# Patient Record
Sex: Female | Born: 1995 | Hispanic: Yes
Health system: Midwestern US, Community
[De-identification: ages and names within clinical notes are randomized; demographics above are authoritative.]

---

## 2016-02-29 ENCOUNTER — Encounter: Payer: Self-pay | Admitting: Gynecology

## 2016-02-29 ENCOUNTER — Emergency Department
Admission: EM | Admit: 2016-02-29 | Discharge: 2016-02-29 | Disposition: A | Discharge status: Home / Self Care | Payer: BLUE CROSS/BLUE SHIELD | Attending: Emergency Medicine | Admitting: Emergency Medicine

## 2016-02-29 ENCOUNTER — Ambulatory Visit (INDEPENDENT_AMBULATORY_CARE_PROVIDER_SITE_OTHER)
Admission: EM | Admit: 2016-02-29 | Discharge: 2016-02-29 | Disposition: A | Discharge status: Home / Self Care | Payer: BLUE CROSS/BLUE SHIELD | Source: Home / Self Care | Attending: Family Medicine | Admitting: Family Medicine

## 2016-02-29 DIAGNOSIS — R51 Headache: Secondary | ICD-10-CM | POA: Diagnosis present

## 2016-02-29 DIAGNOSIS — F329 Major depressive disorder, single episode, unspecified: Secondary | ICD-10-CM | POA: Insufficient documentation

## 2016-02-29 DIAGNOSIS — N390 Urinary tract infection, site not specified: Secondary | ICD-10-CM

## 2016-02-29 DIAGNOSIS — T50901A Poisoning by unspecified drugs, medicaments and biological substances, accidental (unintentional), initial encounter: Secondary | ICD-10-CM | POA: Insufficient documentation

## 2016-02-29 DIAGNOSIS — R112 Nausea with vomiting, unspecified: Secondary | ICD-10-CM | POA: Diagnosis not present

## 2016-02-29 DIAGNOSIS — T39312A Poisoning by propionic acid derivatives, intentional self-harm, initial encounter: Secondary | ICD-10-CM | POA: Diagnosis not present

## 2016-02-29 DIAGNOSIS — R45851 Suicidal ideations: Secondary | ICD-10-CM | POA: Diagnosis not present

## 2016-02-29 DIAGNOSIS — R519 Headache, unspecified: Secondary | ICD-10-CM

## 2016-02-29 DIAGNOSIS — F32A Depression, unspecified: Secondary | ICD-10-CM

## 2016-02-29 LAB — URINALYSIS COMPLETE WITH MICROSCOPIC (ARMC ONLY)
Glucose, UA: NEGATIVE mg/dL
Hgb urine dipstick: NEGATIVE
Nitrite: NEGATIVE
Protein, ur: NEGATIVE mg/dL
Specific Gravity, Urine: 1.02 (ref 1.005–1.030)
pH: 6.5 (ref 5.0–8.0)

## 2016-02-29 LAB — CBC
HCT: 42.9 % (ref 35.0–47.0)
Hemoglobin: 15.1 g/dL (ref 12.0–16.0)
MCH: 30.5 pg (ref 26.0–34.0)
MCHC: 35.2 g/dL (ref 32.0–36.0)
MCV: 86.6 fL (ref 80.0–100.0)
Platelets: 318 K/uL (ref 150–440)
RBC: 4.96 MIL/uL (ref 3.80–5.20)
RDW: 13.1 % (ref 11.5–14.5)
WBC: 7.6 K/uL (ref 3.6–11.0)

## 2016-02-29 LAB — LIPASE, BLOOD: Lipase: 21 U/L (ref 11–51)

## 2016-02-29 LAB — COMPREHENSIVE METABOLIC PANEL
ALK PHOS: 73 U/L (ref 38–126)
ALT: 21 U/L (ref 14–54)
AST: 21 U/L (ref 15–41)
Albumin: 4.5 g/dL (ref 3.5–5.0)
Anion gap: 6 (ref 5–15)
BUN: 13 mg/dL (ref 6–20)
CALCIUM: 9.4 mg/dL (ref 8.9–10.3)
CHLORIDE: 104 mmol/L (ref 101–111)
CO2: 26 mmol/L (ref 22–32)
CREATININE: 0.56 mg/dL (ref 0.44–1.00)
Glucose, Bld: 88 mg/dL (ref 65–99)
Potassium: 3.9 mmol/L (ref 3.5–5.1)
Sodium: 136 mmol/L (ref 135–145)
Total Bilirubin: 1.9 mg/dL — ABNORMAL HIGH (ref 0.3–1.2)
Total Protein: 7.9 g/dL (ref 6.5–8.1)

## 2016-02-29 LAB — PREGNANCY, URINE: Preg Test, Ur: NEGATIVE

## 2016-02-29 LAB — ACETAMINOPHEN LEVEL: Acetaminophen (Tylenol), Serum: <10 ug/mL — ABNORMAL LOW (ref 10–30)

## 2016-02-29 LAB — SALICYLATE LEVEL

## 2016-02-29 MED ORDER — LORAZEPAM 1 MG PO TABS: 1.0000 mg | ORAL_TABLET | Freq: Two times a day (BID) | ORAL | 0 refills | Status: AC

## 2016-02-29 MED ORDER — SODIUM CHLORIDE 0.9 % IV SOLN
Freq: Once | INTRAVENOUS | Status: AC
  Administered 2016-02-29: 1000 mL via INTRAVENOUS

## 2016-02-29 MED ORDER — SULFAMETHOXAZOLE-TRIMETHOPRIM 800-160 MG PO TABS: 1.0000 | ORAL_TABLET | Freq: Two times a day (BID) | ORAL | 0 refills | Status: AC

## 2016-02-29 MED ORDER — METOCLOPRAMIDE HCL 5 MG/ML IJ SOLN
10.0000 mg | Freq: Once | INTRAMUSCULAR | Status: AC
  Administered 2016-02-29: 10 mg via INTRAVENOUS
  Filled 2016-02-29: qty 2

## 2016-02-29 MED ORDER — ONDANSETRON 4 MG PO TBDP
4.0000 mg | ORAL_TABLET | Freq: Once | ORAL | Status: AC
  Administered 2016-02-29: 4 mg via ORAL

## 2016-02-29 MED ORDER — ACETAMINOPHEN 500 MG PO TABS
1000.0000 mg | ORAL_TABLET | Freq: Once | ORAL | Status: AC
  Administered 2016-02-29: 1000 mg via ORAL
  Filled 2016-02-29: qty 2

## 2016-02-29 NOTE — ED Triage Notes (Signed)
Patient c/o x 4 days acid reflux / headache / vomiting / nausea and not sleeping well at nigh / loss of appetite.

## 2016-02-29 NOTE — ED Provider Notes (Addendum)
[facility] Emergency Department Provider Note        Time seen: ----------------------------------------- 3:09 PM on 02/29/2016 -----------------------------------------    I have reviewed the triage vital signs and the nursing notes.   HISTORY  Chief Complaint Headache and Emesis    HPI Cheryl Savage is a 20 y.o. female who presents to ER for possible overdose 5 days ago. Patient states she has a headache all day every day, has had nausea and vomiting associated with it. Patient states she came in today for a sick feeling. She states she is not suicidal or homicidal at this time. She was feeling stressed on Monday night when this event occurred. Patient states she does not know how many Motrin that she took. She denies recent illness. Patient states her stressors are family and work.   No past medical history on file.  There are no active problems to display for this patient.   No past surgical history on file.  Allergies Review of patient's allergies indicates no known allergies.  Social History Social History  Substance Use Topics  . Smoking status: Never Smoker  . Smokeless tobacco: Never Used  . Alcohol use No    Review of Systems Constitutional: Negative for fever. Cardiovascular: Negative for chest pain. Respiratory: Negative for shortness of breath. Gastrointestinal: Negative for abdominal pain, Positive for nausea Genitourinary: Negative for dysuria. Musculoskeletal: Negative for back pain. Skin: Negative for rash. Neurological: Positive for headache  10-point ROS otherwise negative.  ____________________________________________   PHYSICAL EXAM:  VITAL SIGNS: ED Triage Vitals  Enc Vitals Group     BP 02/29/16 1404 121/75     Pulse Rate 02/29/16 1404 100     Resp 02/29/16 1404 18     Temp 02/29/16 1404 98.3 F (36.8 C)     Temp Source 02/29/16 1404 Oral     SpO2 02/29/16 1404 99 %     Weight 02/29/16 1405 135 lb  (61.2 kg)     Height 02/29/16 1405 5\' 1"  (1.549 m)     Head Circumference --      Peak Flow --      Pain Score 02/29/16 1405 0     Pain Loc --      Pain Edu? --      Excl. in GC? --     Constitutional: Alert and oriented. Well appearing and in no distress. Eyes: Conjunctivae are normal. PERRL. Normal extraocular movements. ENT   Head: Normocephalic and atraumatic.   Nose: No congestion/rhinnorhea.   Mouth/Throat: Mucous membranes are moist.   Neck: No stridor. Cardiovascular: Normal rate, regular rhythm. No murmurs, rubs, or gallops. Respiratory: Normal respiratory effort without tachypnea nor retractions. Breath sounds are clear and equal bilaterally. No wheezes/rales/rhonchi. Gastrointestinal: Soft and nontender. Normal bowel sounds Musculoskeletal: Nontender with normal range of motion in all extremities. No lower extremity tenderness nor edema. Neurologic:  Normal speech and language. No gross focal neurologic deficits are appreciated.  Skin:  Skin is warm, dry and intact. No rash noted. Psychiatric: Flat affect, depressed mood ____________________________________________  EKG: Interpreted by me. Sinus rhythm rate 89 bpm, normal PR interval, normal QRS width, normal QT interval. Normal axis.  ____________________________________________  ED COURSE:  Pertinent labs & imaging results that were available during my care of the patient were reviewed by me and considered in my medical decision making (see chart for details). Clinical Course  Patients in no distress, appears depressed but is not suicidal at this point. We will consult all  psychiatry for evaluation. He received IV fluids and Reglan.  Procedures ____________________________________________   LABS (pertinent positives/negatives)  Labs Reviewed  COMPREHENSIVE METABOLIC PANEL - Abnormal; Notable for the following:       Result Value   Total Bilirubin 1.9 (*)    All other components within normal  limits  ACETAMINOPHEN LEVEL - Abnormal; Notable for the following:    Acetaminophen (Tylenol), Serum <10 (*)    All other components within normal limits  LIPASE, BLOOD  CBC  SALICYLATE LEVEL  URINE DRUG SCREEN, QUALITATIVE ([facility] ONLY)  URINALYSIS COMPLETEWITH MICROSCOPIC ([facility] ONLY)  POC URINE PREG, ED   ____________________________________________  FINAL ASSESSMENT AND PLAN  Headache, overdose, depression, UTI  Plan: Patient with labs and imaging as dictated above. Patient has been evaluated by psychiatry and was diagnosed with unspecified depressive disorder. Patient was cleared for discharge to outpatient services. I will place her on Ativan as needed, refer her to outpatient for follow-up. She will also be placed on antibiotics for 3 days for UTI.   Emily FilbertWilliams, Markise Haymer E, MD   Note: This dictation was prepared with Dragon dictation. Any transcriptional errors that result from this process are unintentional    Emily FilbertJonathan E Joanette Silveria, MD 02/29/16 1722    Emily FilbertJonathan E Cayleen Benjamin, MD 02/29/16 [phone]

## 2016-02-29 NOTE — ED Notes (Signed)
Patient states that about 4 days ago she took ibuprofen in an attempt to harm herself. Patient states that she is unsure of the amount that she took. Patient is currently denying any SI or HI.  Patient states that she has had epigastric abdominal pain, nausea and vomiting in the morning. Patient also states that she has a headache that started about 3 days ago.

## 2016-02-29 NOTE — ED Triage Notes (Signed)
States she has depression, states she took a hand full of pills (ibuprofen) 4 days ago. Since then having headache, n/v.  States she came today for the "sick feeling"  Denies wanting help for depression.

## 2016-02-29 NOTE — ED Provider Notes (Signed)
[facility] ____________________________________________  Time seen: Approximately 1:12 PM  I have reviewed the triage vital signs and the nursing notes.   HISTORY  Chief Complaint Emesis; Headache; Gastroesophageal Reflux; and Nausea  HPI Cheryl Savage is a 20 y.o. female presents today for the complaints of intermittent nausea, intermittent abdominal discomfort and intermittent headache. Reports two episodes of vomiting. Patient reports the symptoms have been present over the last 3 days. Patient states at this time she is not having any abdominal pain. Patient reports she feels slightly nauseated and reports she has a very mild headache at this time. Patient reports she has had a decreased appetite in the same timeframe and has not been eating as much. Patient reports she ate a few salty crackers this morning. Reports occasional chills, denies known fevers. Denies diarrhea or constipation. Reports last bowel movement was yesterday and described as normal. Denies any abnormal colored stool or blood in stool.   Denies recent sickness, recent antibiotic use. Denies any chest pain, shortness of breath, dysuria, current dizziness, extremity pain or extremity swelling. Denies any trauma. In discussing with patient, asked multiple times in regards to any recent thoughts of self-harm. Patient then discloses that just prior to symptom onset the night before, she took a "handful" of ibuprofen with intention of self-harm. Patient reports she has been having intermittent suicidal thoughts. Patient denies current suicidal plan at this time. Patient reports that she has been under more stress recently due to work as well as family stress. Patient reports she does not have a lot of family support from her side of the family but does have some support from her fianc side. Patient reports she lives with her fianc's family. Patient denies any homicidal ideations. Patient denies any cutting  behaviors. Patient does report she has a history of suicidal ideation with a past history of attempts by cutting herself. Denies any recent thoughts of suicide until this past week.  Patient denies any drugs or alcohol use. Patient again states that she took a handful of ibuprofen but was unable to clarify approximately how many.    past medical history. Depression Self harm  There are no active problems to display for this patient.   No past surgical history on file.    No current facility-administered medications for this encounter.  No current outpatient prescriptions on file.  Allergies Review of patient's allergies indicates no known allergies.  No family history on file.  Social History Social History  Substance Use Topics  . Smoking status: Never Smoker  . Smokeless tobacco: Never Used  . Alcohol use No    Review of Systems Constitutional: No fever/chills Eyes: No visual changes. ENT: No sore throat. Cardiovascular: Denies chest pain. Respiratory: Denies shortness of breath. Gastrointestinal: As above.  No diarrhea.  No constipation. Genitourinary: Negative for dysuria. Musculoskeletal: Negative for back pain. Skin: Negative for rash. Neurological: Negative for headaches, focal weakness or numbness.  10-point ROS otherwise negative.  ____________________________________________   PHYSICAL EXAM:  VITAL SIGNS: ED Triage Vitals  Enc Vitals Group     BP 02/29/16 1150 112/78     Pulse Rate 02/29/16 1150 (!) 104     Resp -- 18     Temp 02/29/16 1150 99.1 F (37.3 C)     Temp Source 02/29/16 1150 Oral     SpO2 02/29/16 1150 98 %     Weight 02/29/16 1150 135 lb (61.2 kg)     Height 02/29/16 1150 5\' 1"  (1.549 m)  Head Circumference --      Peak Flow --      Pain Score 02/29/16 1157 5     Pain Loc --      Pain Edu? --      Excl. in GC? --    Orthostatic VS for the past 24 hrs:  BP- Lying Pulse- Lying BP- Sitting Pulse- Sitting BP- Standing at 0  minutes Pulse- Standing at 0 minutes  02/29/16 1233 113/73 80 116/79 96 117/85 116      Constitutional: Alert and oriented. Well appearing and in no acute distress. Eyes: Conjunctivae are normal. PERRL. EOMI. ENT      Head: Normocephalic and atraumatic.      Nose: No congestion/rhinnorhea.      Mouth/Throat: Mucous membranes are moist.Oropharynx non-erythematous.  Hematological/Lymphatic/Immunilogical: No cervical lymphadenopathy. Cardiovascular: Normal rate, regular rhythm. Grossly normal heart sounds.  Good peripheral circulation. Respiratory: Normal respiratory effort without tachypnea nor retractions. Breath sounds are clear and equal bilaterally. No wheezes/rales/rhonchi.. Gastrointestinal: Soft and nontender. No distention. Normal Bowel sounds. No CVA tenderness. Musculoskeletal:  Ambulatory with steady gait. No midline cervical, thoracic or lumbar tenderness to palpation.  Neurologic:  Normal speech and language. No gross focal neurologic deficits are appreciated. Speech is normal. No gait instability.  Skin:  Skin is warm, dry and intact. No rash noted. Psychiatric: Flat affect.  Speech and behavior are normal. Patient exhibits appropriate insight and judgment   ___________________________________________   LABS (all labs ordered are listed, but only abnormal results are displayed)  Labs Reviewed  URINALYSIS COMPLETEWITH MICROSCOPIC ([facility] ONLY) - Abnormal; Notable for the following:       Result Value   Color, Urine AMBER (*)    APPearance CLOUDY (*)    Bilirubin Urine SMALL (*)    Ketones, ur TRACE (*)    Leukocytes, UA MODERATE (*)    Bacteria, UA MANY (*)    Squamous Epithelial / LPF 6-30 (*)    All other components within normal limits  PREGNANCY, URINE  CBG MONITORING, ED     PROCEDURES Procedures    INITIAL IMPRESSION / ASSESSMENT AND PLAN / ED COURSE  Pertinent labs & imaging results that were available during my care of the patient were reviewed by me  and considered in my medical decision making (see chart for details).  Overall well-appearing patient, however with a flat affect and not openly disclosing symptoms surrounding her presentation to the urgent care. Presenting today for report of abdominal discomfort, headaches and nausea vomiting over the last few days. Patient initially did not disclose any thoughts of suicidal intent, however in further discussed with patient patient admits to recent suicidal ideation with recent attempt suicide from taking over-the-counter ibuprofen. Patient reports since taking over-the-counter ibuprofen her symptoms then started including GI upset and headaches. Patient does report history of suicidal ideation with self-harm attempts. Patient reports she does not currently have a primary care physician or outpatient resources including psychiatry. Patient lives with fianc's family. Patient denies current plan. Denies homicidal ideation. Discussed in detail with patient regarding concerns of her presentation and her report and recommendations evaluation emergency room at this time including psychiatry evaluation. Patient verbalized understanding of this and agreed to this plan. Patient volunteers to be evaluated in emergency room. EMS and [facility] called. From 4 mg ODT Zofran given once in urgent care, patient then drank fluids and eating crackers in room as her blood sugar was 73.  Patient being transported to [facility] regional ER via [employer]  police officer Adriana [place]ook, for further evaluation and treatment. RN obtain urine sample, urine positive for many bacteria, moderate leukocytes, cloudy, suggestive of urinary tract infection. ER informed.   Discussed follow up with Primary care physician this week. Discussed follow up and return parameters including no resolution or any worsening concerns. Patient verbalized understanding and agreed to plan.   ____________________________________________   FINAL CLINICAL  IMPRESSION(S) / ED DIAGNOSES  Final diagnoses:  Non-intractable vomiting with nausea, vomiting of unspecified type  Suicidal ideations  Ibuprofen overdose, intentional self-harm, initial encounter (HCC)  UTI (lower urinary tract infection)     There are no discharge medications for this patient.   Note: This dictation was prepared with Dragon dictation along with smaller phrase technology. Any transcriptional errors that result from this process are unintentional.    Clinical Course      Renford DillsLindsey Payal Stanforth, NP 02/29/16 1536

## 2016-03-01 LAB — GLUCOSE, CAPILLARY: GLUCOSE-CAPILLARY: 73 mg/dL (ref 65–99)

## 2016-08-12 ENCOUNTER — Emergency Department
Admission: EM | Admit: 2016-08-12 | Discharge: 2016-08-12 | Disposition: A | Discharge status: Home / Self Care | Payer: BLUE CROSS/BLUE SHIELD | Attending: Emergency Medicine | Admitting: Emergency Medicine

## 2016-08-12 ENCOUNTER — Emergency Department: Payer: BLUE CROSS/BLUE SHIELD

## 2016-08-12 ENCOUNTER — Encounter: Payer: Self-pay | Admitting: Emergency Medicine

## 2016-08-12 DIAGNOSIS — Z79899 Other long term (current) drug therapy: Secondary | ICD-10-CM | POA: Insufficient documentation

## 2016-08-12 DIAGNOSIS — Y939 Activity, unspecified: Secondary | ICD-10-CM | POA: Insufficient documentation

## 2016-08-12 DIAGNOSIS — Y929 Unspecified place or not applicable: Secondary | ICD-10-CM | POA: Diagnosis not present

## 2016-08-12 DIAGNOSIS — W1839XA Other fall on same level, initial encounter: Secondary | ICD-10-CM | POA: Diagnosis not present

## 2016-08-12 DIAGNOSIS — T1490XA Injury, unspecified, initial encounter: Secondary | ICD-10-CM

## 2016-08-12 DIAGNOSIS — Y999 Unspecified external cause status: Secondary | ICD-10-CM | POA: Insufficient documentation

## 2016-08-12 DIAGNOSIS — S93432A Sprain of tibiofibular ligament of left ankle, initial encounter: Secondary | ICD-10-CM | POA: Insufficient documentation

## 2016-08-12 DIAGNOSIS — S93492A Sprain of other ligament of left ankle, initial encounter: Secondary | ICD-10-CM

## 2016-08-12 DIAGNOSIS — S99912A Unspecified injury of left ankle, initial encounter: Secondary | ICD-10-CM | POA: Diagnosis present

## 2016-08-12 LAB — POCT PREGNANCY, URINE: PREG TEST UR: NEGATIVE

## 2016-08-12 MED ORDER — NAPROXEN 500 MG PO TABS
ORAL_TABLET | ORAL | Status: AC
Start: 2016-08-12 — End: 2016-08-12
  Administered 2016-08-12: 500 mg via ORAL
  Filled 2016-08-12: qty 1

## 2016-08-12 MED ORDER — NAPROXEN 500 MG PO TBEC
500.0000 mg | DELAYED_RELEASE_TABLET | Freq: Two times a day (BID) | ORAL | 0 refills | Status: AC
Start: 2016-08-12 — End: 2016-08-22

## 2016-08-12 MED ORDER — NAPROXEN 500 MG PO TABS
500.0000 mg | ORAL_TABLET | Freq: Once | ORAL | Status: AC
  Administered 2016-08-12: 500 mg via ORAL

## 2016-08-12 NOTE — ED Provider Notes (Signed)
[facility] Emergency Department Provider Note  ____________________________________________  Time seen: Approximately 11:00 PM  I have reviewed the triage vital signs and the nursing notes.   HISTORY  Chief Complaint Foot Injury; Ankle Pain; and Leg Injury    HPI Cheryl Savage is a 21 y.o. female presenting to the emergency department with left ankle and shank pain. Patient states that she stepped on a bone today and fell, inverting her left ankle. She denies hitting her head or losing consciousness during the fall. Patient states that she can bear weight with pain. Patient states that she has sprained her left ankle numerous times in the past. She denies prior surgeries to the left lower extremity. Patient currently works at a gas station. She is accompanied by her roommate. No alleviating measures attempted attempted.   History reviewed. No pertinent past medical history.  There are no active problems to display for this patient.   History reviewed. No pertinent surgical history.  Prior to Admission medications   Medication Sig Start Date End Date Taking? Authorizing Provider  LORazepam (ATIVAN) 1 MG tablet Take 1 tablet (1 mg total) by mouth 2 (two) times daily. 02/29/16 02/28/17  Emily Filbert, MD  naproxen (EC NAPROSYN) 500 MG EC tablet Take 1 tablet (500 mg total) by mouth 2 (two) times daily with a meal. 08/12/16 08/22/16  Orvil Feil, PA-C  sulfamethoxazole-trimethoprim (BACTRIM DS) 800-160 MG tablet Take 1 tablet by mouth 2 (two) times daily. 02/29/16   Emily Filbert, MD    Allergies Patient has no known allergies.  History reviewed. No pertinent family history.  Social History Social History  Substance Use Topics  . Smoking status: Never Smoker  . Smokeless tobacco: Never Used  . Alcohol use No     Review of Systems  Constitutional: No fever/chills Eyes: No visual changes. No discharge ENT: No upper respiratory  complaints. Cardiovascular: no chest pain. Respiratory: no cough. No SOB. Gastrointestinal: No abdominal pain.  No nausea, no vomiting.  No diarrhea.  No constipation. Musculoskeletal: Patient has left ankle and shank pain.  Skin: Negative for rash, abrasions, lacerations, ecchymosis. Neurological: Negative for headaches, focal weakness or numbness. ____________________________________________   PHYSICAL EXAM:  VITAL SIGNS: ED Triage Vitals [08/12/16 2104]  Enc Vitals Group     BP 132/84     Pulse Rate 100     Resp 15     Temp 98 F (36.7 C)     Temp Source Oral     SpO2 97 %     Weight 140 lb (63.5 kg)     Height 5\' 1"  (1.549 m)     Head Circumference      Peak Flow      Pain Score      Pain Loc      Pain Edu?      Excl. in GC?      Constitutional: Alert and oriented. Well appearing and in no acute distress. Eyes: Conjunctivae are normal. PERRL. EOMI. Respiratory: Normal respiratory effort without tachypnea or retractions. Lungs CTAB. Good air entry to the bases with no decreased or absent breath sounds. Musculoskeletal: Left ankle: Patient has limited flexion and extension at the left ankle, likely secondary to pain. Patient is able to move all 5 toes. Patient has pain elicited with palpation along the fibular head. Palpable dorsalis pedis pulse bilaterally and symmetrically. Neurologic:  Normal speech and language. No gross focal neurologic deficits are appreciated. Flexes are 2+ and symmetric in the  lower extremities bilaterally. Skin:  Patient has edema and ecchymosis of the skin overlying the left ankle, particularly along the distribution of the anterior talofibular ligament. Psychiatric: Mood and affect are normal. Speech and behavior are normal. Patient exhibits appropriate insight and judgement.   ____________________________________________   LABS (all labs ordered are listed, but only abnormal results are displayed)  Labs Reviewed  POC URINE PREG, ED  POCT  PREGNANCY, URINE   ____________________________________________  EKG   ____________________________________________  RADIOLOGY Geraldo PitterI, Jaclyn M Woods, personally viewed and evaluated these images (plain radiographs) as part of my medical decision making, as well as reviewing the written report by the radiologist.  Dg Tibia/fibula Left  Result Date: 08/12/2016 CLINICAL DATA:  Stepped on dog bone and fell. Tenderness over fibular head. EXAM: LEFT TIBIA AND FIBULA - 2 VIEW COMPARISON:  LEFT ankle radiograph August 12, 2016 at 2104 hours FINDINGS: There is no evidence of fracture or other focal bone lesions. Soft tissues are unremarkable. IMPRESSION: Negative. Electronically Signed   By: Awilda Metroourtnay  Bloomer M.D.   On: 08/12/2016 23:23   Dg Ankle Complete Left  Result Date: 08/12/2016 CLINICAL DATA:  Left ankle pain after stepping on and off bone and falling. EXAM: LEFT ANKLE COMPLETE - 3+ VIEW COMPARISON:  None. FINDINGS: Mild soft tissue swelling is noted about the ankle joint. Intact ankle mortise. No fracture identified. No joint effusion is seen. Base of fifth metatarsal appears intact. Subtalar joint is congruent. Midfoot articulations are maintained. IMPRESSION: Periarticular soft tissue swelling about the ankle joint without acute osseous abnormality. Electronically Signed   By: Tollie Ethavid  Kwon M.D.   On: 08/12/2016 21:49   Dg Foot Complete Left  Result Date: 08/12/2016 CLINICAL DATA:  Pain after tripping injury. EXAM: LEFT FOOT - COMPLETE 3+ VIEW COMPARISON:  None. FINDINGS: There is no evidence of fracture or dislocation. There is no evidence of arthropathy or other focal bone abnormality. Mild soft tissue swelling about the malleoli. The Lisfranc articulation appears congruent. IMPRESSION: Negative for acute fracture or malalignment. Electronically Signed   By: Tollie Ethavid  Kwon M.D.   On: 08/12/2016 21:53    ____________________________________________    PROCEDURES  Procedure(s) performed:     Procedures    Medications  naproxen (NAPROSYN) tablet 500 mg (500 mg Oral Given 08/12/16 2304)     ____________________________________________   INITIAL IMPRESSION / ASSESSMENT AND PLAN / ED COURSE  Pertinent labs & imaging results that were available during my care of the patient were reviewed by me and considered in my medical decision making (see chart for details).  Review of the [place] CSRS was performed in accordance of the [facility] prior to dispensing any controlled drugs.     Assessment and Plan: Left Ankle Sprain Presents to the emergency department with left ankle and shank pain after sustaining an inversion type injury earlier today. DG left foot and DG left ankle reveal no acute fractures or bony abnormalities. DG left tibia-fibula also reveals no acute fractures. An Ace wrap was applied in the emergency department. Patient was provided crutches. Patient was discharged with naproxen. A referral was made to orthopedics, Dr. Joice LoftsPoggi. Patient was advised to make an appointment with Dr. Joice LoftsPoggi in one week if left ankle pain persists. All patient questions were answered.   ____________________________________________  FINAL CLINICAL IMPRESSION(S) / ED DIAGNOSES  Final diagnoses:  Injury  Sprain of anterior talofibular ligament of left ankle, initial encounter      NEW MEDICATIONS STARTED DURING THIS VISIT:  Discharge Medication List as  of 08/12/2016 11:33 PM    START taking these medications   Details  naproxen (EC NAPROSYN) 500 MG EC tablet Take 1 tablet (500 mg total) by mouth 2 (two) times daily with a meal., Starting Wed 08/12/2016, Until Sat 08/22/2016, Print            This chart was dictated using voice recognition software/Dragon. Despite best efforts to proofread, errors can occur which can change the meaning. Any change was purely unintentional.    Orvil Feil, PA-C 08/13/16 0026    Governor Rooks, MD 08/15/16 [phone]

## 2016-08-12 NOTE — ED Notes (Signed)
See triage note, pt states about an hour prior to arrival she stepped on a dog bone and fell. Pt states she "felt something weird in her left leg when it happened." Pt states since that time the pain has progressively moved upward. Pt reports tenderness to left foot and reports when walking "the bottom of my foot hurts." Pulses intact, pt A&O at this time.

## 2016-08-12 NOTE — ED Triage Notes (Signed)
Pt c/o left foot, ankle and lower leg pain after stepping on a dog bone and falling on left foot/ankle. Upon assessment ankle and foot are swollen and tender to the touch, no deformity noted.

## 2016-08-28 ENCOUNTER — Emergency Department: Payer: BLUE CROSS/BLUE SHIELD

## 2016-08-28 ENCOUNTER — Emergency Department
Admission: EM | Admit: 2016-08-28 | Discharge: 2016-08-28 | Disposition: A | Discharge status: Home / Self Care | Payer: BLUE CROSS/BLUE SHIELD | Attending: Emergency Medicine | Admitting: Emergency Medicine

## 2016-08-28 ENCOUNTER — Encounter: Payer: Self-pay | Admitting: Emergency Medicine

## 2016-08-28 DIAGNOSIS — S8991XA Unspecified injury of right lower leg, initial encounter: Secondary | ICD-10-CM | POA: Diagnosis present

## 2016-08-28 DIAGNOSIS — Y998 Other external cause status: Secondary | ICD-10-CM | POA: Diagnosis not present

## 2016-08-28 DIAGNOSIS — S82114A Nondisplaced fracture of right tibial spine, initial encounter for closed fracture: Secondary | ICD-10-CM | POA: Insufficient documentation

## 2016-08-28 DIAGNOSIS — Y9355 Activity, bike riding: Secondary | ICD-10-CM | POA: Diagnosis not present

## 2016-08-28 DIAGNOSIS — Y92410 Unspecified street and highway as the place of occurrence of the external cause: Secondary | ICD-10-CM | POA: Diagnosis not present

## 2016-08-28 DIAGNOSIS — M25561 Pain in right knee: Secondary | ICD-10-CM

## 2016-08-28 MED ORDER — TRAMADOL HCL 50 MG PO TABS: 50.0000 mg | ORAL_TABLET | Freq: Four times a day (QID) | ORAL | 0 refills | Status: AC | PRN

## 2016-08-28 MED ORDER — TRAMADOL HCL 50 MG PO TABS
ORAL_TABLET | ORAL | Status: AC
  Filled 2016-08-28: qty 1

## 2016-08-28 MED ORDER — TRAMADOL HCL 50 MG PO TABS
50.0000 mg | ORAL_TABLET | Freq: Once | ORAL | Status: AC
  Administered 2016-08-28: 50 mg via ORAL

## 2016-08-28 NOTE — ED Notes (Signed)
Patient transported to X-ray 
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
## 2016-08-28 NOTE — Discharge Instructions (Signed)
Please seek medical attention for any high fevers, chest pain, shortness of breath, change in behavior, persistent vomiting, bloody stool or any other new or concerning symptoms.  
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
## 2021-04-26 ENCOUNTER — Inpatient Hospital Stay
Admit: 2021-04-26 | Discharge: 2021-04-26 | Disposition: A | Discharge status: Home / Self Care | Payer: PRIVATE HEALTH INSURANCE | Attending: Emergency Medicine

## 2021-04-26 LAB — CBC WITH AUTOMATED DIFF
ABS. BASOPHILS: 0 10*3/uL (ref 0.0–0.1)
ABS. EOSINOPHILS: 0.1 10*3/uL (ref 0.0–0.4)
ABS. IMM. GRANS.: 0 10*3/uL (ref 0.00–0.04)
ABS. LYMPHOCYTES: 2.5 10*3/uL (ref 0.9–3.6)
ABS. MONOCYTES: 0.5 10*3/uL (ref 0.05–1.2)
ABS. NEUTROPHILS: 6.4 10*3/uL (ref 1.8–8.0)
ABSOLUTE NRBC: 0 10*3/uL (ref 0.00–0.01)
BASOPHILS: 0 % (ref 0–2)
EOSINOPHILS: 1 % (ref 0–5)
HCT: 41.6 % (ref 35.0–45.0)
HGB: 14.8 g/dL (ref 12.0–16.0)
IMMATURE GRANULOCYTES: 0 % (ref 0.0–0.5)
LYMPHOCYTES: 26 % (ref 21–52)
MCH: 30.5 PG (ref 24.0–34.0)
MCHC: 35.6 g/dL (ref 31.0–37.0)
MCV: 85.6 FL (ref 78.0–100.0)
MONOCYTES: 5 % (ref 3–10)
MPV: 10 FL (ref 9.2–11.8)
NEUTROPHILS: 67 % (ref 40–73)
NRBC: 0 PER 100 WBC
PLATELET: 395 10*3/uL (ref 135–420)
RBC: 4.86 M/uL (ref 4.20–5.30)
RDW: 13.2 % (ref 11.6–14.5)
WBC: 9.7 10*3/uL (ref 4.6–13.2)

## 2021-04-26 LAB — URINE MICROSCOPIC ONLY
RBC, UA: 0 /hpf (ref 0–5)
RBC: 0 /hpf (ref 0–5)
WBC, UA: 0 /hpf (ref 0–5)
WBC: 0 /hpf (ref 0–5)

## 2021-04-26 LAB — METABOLIC PANEL, COMPREHENSIVE
A-G Ratio: 1.2 (ref 0.8–1.7)
ALT (SGPT): 67 U/L — ABNORMAL HIGH (ref 13–56)
AST (SGOT): 27 U/L (ref 10–38)
Albumin: 3.7 g/dL (ref 3.4–5.0)
Alk. phosphatase: 95 U/L (ref 45–117)
Anion gap: 8 mmol/L (ref 3.0–18)
BUN/Creatinine ratio: 18 (ref 12–20)
BUN: 12 MG/DL (ref 7.0–18)
Bilirubin, total: 0.9 MG/DL (ref 0.2–1.0)
CO2: 23 mmol/L (ref 21–32)
Calcium: 9.5 MG/DL (ref 8.5–10.1)
Chloride: 109 mmol/L (ref 100–111)
Creatinine: 0.67 MG/DL (ref 0.6–1.3)
Globulin: 3.2 g/dL (ref 2.0–4.0)
Glucose: 114 mg/dL — ABNORMAL HIGH (ref 74–99)
Potassium: 4.1 mmol/L (ref 3.5–5.5)
Protein, total: 6.9 g/dL (ref 6.4–8.2)
Sodium: 140 mmol/L (ref 136–145)
eGFR: >60 mL/min/{1.73_m2} (ref >60)

## 2021-04-26 LAB — LIPASE
Lipase: 101 U/L (ref 73–393)
Lipase: 101 U/L (ref 73–393)

## 2021-04-26 LAB — URINALYSIS W/ RFLX MICROSCOPIC
Bilirubin, Urine: NEGATIVE
Bilirubin: NEGATIVE
Blood, Urine: NEGATIVE
Blood: NEGATIVE
Glucose, Ur: NEGATIVE mg/dL
Glucose: NEGATIVE mg/dL
Ketone: NEGATIVE mg/dL
Ketones, Urine: NEGATIVE mg/dL
Nitrite, Urine: NEGATIVE
Nitrites: NEGATIVE
Protein, UA: NEGATIVE mg/dL
Protein: NEGATIVE mg/dL
Specific Gravity, UA: 1.025 (ref 1.005–1.030)
Specific gravity: 1.025 (ref 1.005–1.030)
Urobilinogen, UA, POCT: 1 EU/dL (ref 0.2–1.0)
Urobilinogen: 1 EU/dL (ref 0.2–1.0)
pH (UA): 6.5 (ref 5.0–8.0)
pH, UA: 6.5 (ref 5.0–8.0)

## 2021-04-26 LAB — HCG URINE, QL. - POC
HCG, Pregnancy, Urine, POC: NEGATIVE
Pregnancy test,urine (POC): NEGATIVE

## 2021-04-26 LAB — TROPONIN-HIGH SENSITIVITY: Troponin-High Sensitivity: <3 ng/L (ref 0–54)

## 2021-04-26 LAB — HCG URINE, QL
HCG urine, QL: NEGATIVE
Pregnancy Test(Urn): NEGATIVE

## 2021-04-26 LAB — CBC WITH AUTO DIFFERENTIAL
Basophils %: 0 % (ref 0–2)
Basophils Absolute: 0 10*3/uL (ref 0.0–0.1)
Eosinophils %: 1 % (ref 0–5)
Eosinophils Absolute: 0.1 10*3/uL (ref 0.0–0.4)
Granulocyte Absolute Count: 0 10*3/uL (ref 0.00–0.04)
Hematocrit: 41.6 % (ref 35.0–45.0)
Hemoglobin: 14.8 g/dL (ref 12.0–16.0)
Immature Granulocytes: 0 % (ref 0.0–0.5)
Lymphocytes %: 26 % (ref 21–52)
Lymphocytes Absolute: 2.5 10*3/uL (ref 0.9–3.6)
MCH: 30.5 PG (ref 24.0–34.0)
MCHC: 35.6 g/dL (ref 31.0–37.0)
MCV: 85.6 FL (ref 78.0–100.0)
MPV: 10 FL (ref 9.2–11.8)
Monocytes %: 5 % (ref 3–10)
Monocytes Absolute: 0.5 10*3/uL (ref 0.05–1.2)
NRBC Absolute: 0 10*3/uL (ref 0.00–0.01)
Neutrophils %: 67 % (ref 40–73)
Neutrophils Absolute: 6.4 10*3/uL (ref 1.8–8.0)
Nucleated RBCs: 0 PER 100 WBC
Platelets: 395 10*3/uL (ref 135–420)
RBC: 4.86 M/uL (ref 4.20–5.30)
RDW: 13.2 % (ref 11.6–14.5)
WBC: 9.7 10*3/uL (ref 4.6–13.2)

## 2021-04-26 LAB — COMPREHENSIVE METABOLIC PANEL
ALT: 67 U/L — ABNORMAL HIGH (ref 13–56)
AST: 27 U/L (ref 10–38)
Albumin/Globulin Ratio: 1.2 (ref 0.8–1.7)
Albumin: 3.7 g/dL (ref 3.4–5.0)
Alkaline Phosphatase: 95 U/L (ref 45–117)
Anion Gap: 8 mmol/L (ref 3.0–18)
BUN: 12 MG/DL (ref 7.0–18)
Bun/Cre Ratio: 18 (ref 12–20)
CO2: 23 mmol/L (ref 21–32)
Calcium: 9.5 MG/DL (ref 8.5–10.1)
Chloride: 109 mmol/L (ref 100–111)
Creatinine: 0.67 MG/DL (ref 0.6–1.3)
ESTIMATED GLOMERULAR FILTRATION RATE: >60 mL/min/{1.73_m2} (ref >60)
Globulin: 3.2 g/dL (ref 2.0–4.0)
Glucose: 114 mg/dL — ABNORMAL HIGH (ref 74–99)
Potassium: 4.1 mmol/L (ref 3.5–5.5)
Sodium: 140 mmol/L (ref 136–145)
Total Bilirubin: 0.9 MG/DL (ref 0.2–1.0)
Total Protein: 6.9 g/dL (ref 6.4–8.2)

## 2021-04-26 LAB — TROPONIN, HIGH SENSITIVITY: Troponin, High Sensitivity: <3 ng/L (ref 0–54)

## 2021-04-26 MED ORDER — DICYCLOMINE 20 MG TAB
20 mg | ORAL_TABLET | Freq: Four times a day (QID) | ORAL | 0 refills | Status: AC
Start: 2021-04-26 — End: 2021-05-01

## 2021-04-26 MED ORDER — ONDANSETRON 4 MG TAB, RAPID DISSOLVE
4 mg | ORAL_TABLET | Freq: Three times a day (TID) | ORAL | 0 refills | Status: AC | PRN
Start: 2021-04-26 — End: ?

## 2021-04-26 MED ADMIN — famotidine (PF) (PEPCID) injection 20 mg: INTRAVENOUS | @ 13:00:00 | NDC 63323073941

## 2021-04-26 MED ADMIN — dicyclomine (BENTYL) 10 mg/mL injection 20 mg: INTRAMUSCULAR | @ 13:00:00 | NDC 58914008052

## 2021-04-26 MED ADMIN — ondansetron (ZOFRAN) injection 4 mg: INTRAVENOUS | @ 13:00:00 | NDC 23155054831

## 2021-04-26 MED ADMIN — morphine injection 4 mg: INTRAVENOUS | @ 13:00:00 | NDC 00641612501

## 2021-04-26 MED FILL — MORPHINE 4 MG/ML INTRAVENOUS SOLUTION: 4 mg/mL | INTRAVENOUS | Qty: 1

## 2021-04-26 MED FILL — FAMOTIDINE (PF) 20 MG/2 ML IV: 20 mg/2 mL | INTRAVENOUS | Qty: 2

## 2021-04-26 MED FILL — BENTYL 10 MG/ML INTRAMUSCULAR SOLUTION: 10 mg/mL | INTRAMUSCULAR | Qty: 2

## 2021-04-26 MED FILL — ONDANSETRON (PF) 4 MG/2 ML INJECTION: 4 mg/2 mL | INTRAMUSCULAR | Qty: 2

## 2021-04-26 NOTE — ED Notes (Signed)
I have reviewed discharge instructions with the patient.  The patient verbalized understanding.

## 2021-04-26 NOTE — ED Provider Notes (Signed)
ED Provider Notes by Richardine Service, PA-C at 04/26/21 0825                Author: Selinda Flavin  Service: Emergency Medicine  Author Type: Physician Assistant       Filed: 04/26/21 1006  Date of Service: 04/26/21 0825  Status: Attested           Editor: Selinda Flavin (Physician Assistant)  Cosigner: Luetta Nutting, DO at 04/26/21 1140          Attestation signed by Luetta Nutting, DO at 04/26/21 1140          I, Luetta Nutting, DO was available for consultation in the Emergency department. The patient was seen and examined by the midlevel provider. Unless it was otherwise  noted in the documentation, I did not personally examine the patient. I have reviewed the documentation provided by the [place] provider.      Luetta Nutting, DO                                 EMERGENCY DEPARTMENT HISTORY AND PHYSICAL EXAM      Date: 04/26/2021   Patient Name: Christina Krause        History of Presenting Illness                 History Provided By: Patient      Chief Complaint: RUQ abdominal pain      HPI(Context):    8:25 AM   Jayel Scaduto is a 25 y.o. female with PMHX of depression who presents to the emergency department C/O constant RUQ abdominal pain. Associated sxs include chills, nausea and vomiting. Sxs x 2-3 days. Pain is sharp and radiates to right upper back. Pt  notes anorexia. No at home tx. Pt notes nothing makes pain worse. Pt denies fever, diarrhea, congestion, and any other sxs or complaints. Pt is nonsmoker. No ETOH. No illicit drugs.       PCP: None        Current Outpatient Medications          Medication  Sig  Dispense  Refill           ?  testosterone (ANDROGEL) 1 % (25 mg/2.5gram) glpk  2.5 g by TransDERmal route daily.         ?  ARIPiprazole (Abilify) 5 mg tablet  Take  by mouth daily.         ?  dicyclomine (BENTYL) 20 mg tablet  Take 1 Tablet by mouth every six (6) hours for 5 days. As needed for abdominal pain  20 Tablet  0           ?  ondansetron (ZOFRAN ODT) 4 mg disintegrating  tablet  Take 1 Tablet by mouth every eight (8) hours as needed for Nausea or Vomiting.  12 Tablet  0             Past History        Past Medical History:     Past Medical History:        Diagnosis  Date         ?  Depression             Past Surgical History:   History reviewed. No pertinent surgical history.      Family History:   History reviewed. No pertinent family history.  Social History:     Social History          Tobacco Use         ?  Smoking status:  Never       Substance Use Topics         ?  Alcohol use:  Never         ?  Drug use:  Never           Allergies:   No Known Allergies           Review of Systems     Review of Systems    Constitutional:  Positive for appetite change and chills . Negative for fever.    Respiratory:  Positive for cough (mild).     Cardiovascular:  Negative for chest pain.    Gastrointestinal:  Positive for abdominal pain, nausea  and vomiting. Negative for diarrhea.    Genitourinary:  Negative for dysuria.    Musculoskeletal:  Positive for back pain.    All other systems reviewed and are negative.        Physical Exam          Vitals:             04/26/21 0706  04/26/21 0840  04/26/21 0933  04/26/21 0940           BP:  116/71      111/61     Pulse:  100           Resp:  16           Temp:  98 ??F (36.7 ??C)           SpO2:  98%  98%  97%       Weight:  113.4 kg (250 lb)                 Height:  5' (1.524 m)              Physical Exam   Vitals and nursing note reviewed.    Constitutional:        General: She is not in acute distress.      Appearance: She is well-developed. She is not diaphoretic.       Comments: Obese caucasian female in NAD. Alert. Appears uncomfortable at times.     HENT:       Head: Normocephalic and atraumatic.       Right Ear: External ear normal.       Left Ear: External ear normal.       Nose: Nose normal.    Eyes:       General: No scleral icterus.         Right eye: No discharge.          Left eye: No discharge.       Conjunctiva/sclera: Conjunctivae  normal.     Cardiovascular:       Rate and Rhythm: Normal rate and regular rhythm.       Heart sounds: Normal heart sounds. No murmur heard.     No friction rub. No gallop.    Pulmonary:       Effort: Pulmonary effort is normal. No tachypnea, accessory muscle usage or respiratory distress.       Breath sounds: Normal breath sounds. No decreased breath sounds, wheezing, rhonchi or rales.     Abdominal:       Palpations: Abdomen is soft.  Tenderness: There is abdominal tenderness in the right upper quadrant . There is no right CVA tenderness, left CVA tenderness, guarding or rebound. Negative signs include Murphy's sign and McBurney's sign.     Musculoskeletal:          General: Normal range of motion.       Cervical back: Normal range of motion.    Skin:      General: Skin is warm and dry.    Neurological:       Mental Status: She is alert and oriented to person, place, and time.    Psychiatric:          Judgment: Judgment normal.                  Diagnostic Study Results        Labs -         Recent Results (from the past 12 hour(s))     CBC WITH AUTOMATED DIFF          Collection Time: 04/26/21  7:15 AM         Result  Value  Ref Range            WBC  9.7  4.6 - 13.2 K/uL       RBC  4.86  4.20 - 5.30 M/uL       HGB  14.8  12.0 - 16.0 g/dL       HCT  41.6  35.0 - 45.0 %       MCV  85.6  78.0 - 100.0 FL       MCH  30.5  24.0 - 34.0 PG       MCHC  35.6  31.0 - 37.0 g/dL       RDW  13.2  11.6 - 14.5 %       PLATELET  395  135 - 420 K/uL       MPV  10.0  9.2 - 11.8 FL       NRBC  0.0  0 PER 100 WBC       ABSOLUTE NRBC  0.00  0.00 - 0.01 K/uL       NEUTROPHILS  67  40 - 73 %       LYMPHOCYTES  26  21 - 52 %       MONOCYTES  5  3 - 10 %       EOSINOPHILS  1  0 - 5 %       BASOPHILS  0  0 - 2 %       IMMATURE GRANULOCYTES  0  0.0 - 0.5 %       ABS. NEUTROPHILS  6.4  1.8 - 8.0 K/UL       ABS. LYMPHOCYTES  2.5  0.9 - 3.6 K/UL       ABS. MONOCYTES  0.5  0.05 - 1.2 K/UL       ABS. EOSINOPHILS  0.1  0.0 - 0.4 K/UL        ABS. BASOPHILS  0.0  0.0 - 0.1 K/UL       ABS. IMM. GRANS.  0.0  0.00 - 0.04 K/UL       DF  AUTOMATED          METABOLIC PANEL, COMPREHENSIVE          Collection Time: 04/26/21  7:15 AM         Result  Value  Ref Range  Sodium  140  136 - 145 mmol/L       Potassium  4.1  3.5 - 5.5 mmol/L       Chloride  109  100 - 111 mmol/L       CO2  23  21 - 32 mmol/L       Anion gap  8  3.0 - 18 mmol/L       Glucose  114 (H)  74 - 99 mg/dL       BUN  12  7.0 - 18 MG/DL       Creatinine  0.67  0.6 - 1.3 MG/DL       BUN/Creatinine ratio  18  12 - 20         eGFR  >60  >60 ml/min/1.67m       Calcium  9.5  8.5 - 10.1 MG/DL       Bilirubin, total  0.9  0.2 - 1.0 MG/DL       ALT (SGPT)  67 (H)  13 - 56 U/L       AST (SGOT)  27  10 - 38 U/L       Alk. phosphatase  95  45 - 117 U/L       Protein, total  6.9  6.4 - 8.2 g/dL       Albumin  3.7  3.4 - 5.0 g/dL       Globulin  3.2  2.0 - 4.0 g/dL       A-G Ratio  1.2  0.8 - 1.7         URINALYSIS W/ RFLX MICROSCOPIC          Collection Time: 04/26/21  7:15 AM         Result  Value  Ref Range            Color  YELLOW          Appearance  CLEAR          Specific gravity  1.025  1.005 - 1.030         pH (UA)  6.5  5.0 - 8.0         Protein  Negative  NEG mg/dL       Glucose  Negative  NEG mg/dL       Ketone  Negative  NEG mg/dL       Bilirubin  Negative  NEG         Blood  Negative  NEG         Urobilinogen  1.0  0.2 - 1.0 EU/dL       Nitrites  Negative  NEG         Leukocyte Esterase  SMALL (A)  NEG         LIPASE          Collection Time: 04/26/21  7:15 AM         Result  Value  Ref Range            Lipase  101  73 - 393 U/L       HCG URINE, QL          Collection Time: 04/26/21  7:15 AM         Result  Value  Ref Range            HCG urine, QL  Negative  NEG         URINE MICROSCOPIC ONLY  Collection Time: 04/26/21  7:15 AM         Result  Value  Ref Range            WBC  0 to 3  0 - 5 /hpf       RBC  0 to 3  0 - 5 /hpf       Epithelial cells  1+  0 - 5 /lpf        Bacteria  1+ (A)  NEG /hpf       Mucus  1+ (A)  NEG /lpf       TROPONIN-HIGH SENSITIVITY          Collection Time: 04/26/21  7:15 AM         Result  Value  Ref Range            Troponin-High Sensitivity  <3  0 - 54 ng/L       HCG URINE, QL. - POC          Collection Time: 04/26/21  7:34 AM         Result  Value  Ref Range            Pregnancy test,urine (POC)  Negative  NEG         EKG, 12 LEAD, INITIAL          Collection Time: 04/26/21  8:49 AM         Result  Value  Ref Range            Ventricular Rate  81  BPM       Atrial Rate  81  BPM       P-R Interval  146  ms       QRS Duration  72  ms       Q-T Interval  362  ms       QTC Calculation (Bezet)  420  ms       Calculated P Axis  23  degrees       Calculated R Axis  48  degrees       Calculated T Axis  15  degrees       Diagnosis                 Normal sinus rhythm   Normal ECG   No previous ECGs available              Radiologic Studies        US GALLBLADDER       Final Result          1. Normal appearance of the gallbladder and biliary tree. Specifically, no     cholelithiasis, biliary dilation or findings of cholecystitis.          2. Hepatomegaly with increased hepatic parenchymal echogenicity. While     nonspecific, this is most commonly related to hepatic steatosis and/or     underlying hepatocellular disease.                 XR CHEST PORT       Final Result     No acute cardiopulmonary findings.                 CT Results  (Last 48 hours)             None                    CXR Results  (  Last 48 hours)                                       04/26/21 0850    XR CHEST PORT  Final result            Impression:    No acute cardiopulmonary findings.                       Narrative:    EXAM: Chest radiograph             CLINICAL INDICATION/HISTORY: Cough         --Additional history: None.             COMPARISON: None             TECHNIQUE: Frontal view of the chest             _______________             FINDINGS:             SUPPORT DEVICES: None.              HEART AND MEDIASTINUM: Cardiac silhouette is normal in size. Normal mediastinal      contours . Normal pulmonary vasculature.             LUNGS AND PLEURAL SPACES: No focal airspace opacity. Sharp costophrenic sulci.      No pneumothoraces.             BONY THORAX AND SOFT TISSUES: Unremarkable.             _______________                                       Medications given in the ED-     Medications       morphine injection 4 mg (4 mg IntraVENous Given 04/26/21 0836)     ondansetron (ZOFRAN) injection 4 mg (4 mg IntraVENous Given 04/26/21 0834)       famotidine (PF) (PEPCID) injection 20 mg (20 mg IntraVENous Given 04/26/21 0834)       dicyclomine (BENTYL) 10 mg/mL injection 20 mg (20 mg IntraMUSCular Given 04/26/21 1610)                Medical Decision Making     I am the first provider for this patient.      I reviewed the vital signs, available nursing notes, past medical history, past surgical history, family history and social history.      Vital Signs-Reviewed the patient's vital signs.      Pulse Oximetry Analysis - 98% on RA. NORMAL       Records Reviewed: Nursing Notes and Old Medical Records      Provider Notes (Medical Decision Making): GB, colitis, diverticulitis, gastroenteritis, pancreatitis, GERD, PUD, colitis, stone, UTI, PNA. Doubt cardiac.       Procedures:   Procedures      ED Course:    8:25 AM Initial assessment performed. The patients presenting problems have been discussed, and they are in agreement with the care plan formulated and outlined with them.  I have encouraged them to ask questions as they arise throughout their visit.        Diagnosis and Disposition  Pt feeling better. Labs reassuring. RUQ US unremarkable for GB pathology. No pain at McBurney's. Doubt need for CT. Will tx sxs as suspect viral. Reasons to RTED discussed with pt. All questions answered. Pt feels comfortable going home at this time.  Pt expressed understanding and she agrees with plan.                1.  RUQ abdominal pain         2.  Nausea and vomiting, unspecified vomiting type            PLAN:   1. D/C Home   2.      Current Discharge Medication List                 START taking these medications          Details        dicyclomine (BENTYL) 20 mg tablet  Take 1 Tablet by mouth every six (6) hours for 5 days. As needed for abdominal pain   Qty: 20 Tablet, Refills: 0   Start date: 04/26/2021, End date: 05/01/2021               ondansetron (ZOFRAN ODT) 4 mg disintegrating tablet  Take 1 Tablet by mouth every eight (8) hours as needed for Nausea or Vomiting.   Qty: 12 Tablet, Refills: 0   Start date: 04/26/2021                      3.      Follow-up Information                  Follow up With  Specialties  Details  Why  Contact Info              El-Safadi, Marcia Brash, MD  Gastroenterology      [address]   [place] [address]   [place], [place], [facility]  Internal Medicine Physician      [address]   [place] [address]   [phone]                 [facility] EMERGENCY DEPT  Emergency Medicine      [place]   [phone]                  _______________________________      Attestations:   This note is prepared by Dionne Bucy, PA-C.   _______________________________            Please note that this dictation was completed with Dragon, the computer voice recognition software.  Quite often unanticipated grammatical, syntax, homophones, and other interpretive errors are  inadvertently transcribed by the computer software.  Please disregard these errors.  Please excuse any errors that have escaped final proofreading.

## 2021-04-26 NOTE — ED Notes (Signed)
Patient reports she has abdominal pain, back pain vomiting for 2 days

## 2021-05-04 LAB — EKG, 12 LEAD, INITIAL
Atrial Rate: 81 {beats}/min
Calculated P Axis: 23 degrees
Calculated R Axis: 48 degrees
Calculated T Axis: 15 degrees
Diagnosis: NORMAL
P-R Interval: 146 ms
Q-T Interval: 362 ms
QRS Duration: 72 ms
QTC Calculation (Bezet): 420 ms
Ventricular Rate: 81 {beats}/min

## 2021-05-04 LAB — EKG 12-LEAD
Atrial Rate: 81 {beats}/min
Diagnosis: NORMAL
P Axis: 23 degrees
P-R Interval: 146 ms
Q-T Interval: 362 ms
QRS Duration: 72 ms
QTc Calculation (Bazett): 420 ms
R Axis: 48 degrees
T Axis: 15 degrees
Ventricular Rate: 81 {beats}/min
